# Patient Record
Sex: Female | Born: 1990 | Race: White | Hispanic: No | Marital: Married | Smoking: Current every day smoker
Health system: Southern US, Community
[De-identification: ages and names within clinical notes are randomized; demographics above are authoritative.]

---

## 2017-11-01 ENCOUNTER — Encounter (HOSPITAL_COMMUNITY): Payer: Self-pay

## 2017-11-01 ENCOUNTER — Emergency Department (HOSPITAL_COMMUNITY)
Admission: EM | Admit: 2017-11-01 | Discharge: 2017-11-01 | Disposition: A | Discharge status: Home / Self Care | Payer: Managed Care, Other (non HMO) | Attending: Emergency Medicine | Admitting: Emergency Medicine

## 2017-11-01 ENCOUNTER — Other Ambulatory Visit: Payer: Self-pay

## 2017-11-01 ENCOUNTER — Emergency Department (HOSPITAL_COMMUNITY): Payer: Managed Care, Other (non HMO)

## 2017-11-01 DIAGNOSIS — F17200 Nicotine dependence, unspecified, uncomplicated: Secondary | ICD-10-CM | POA: Diagnosis not present

## 2017-11-01 DIAGNOSIS — N10 Acute tubulo-interstitial nephritis: Secondary | ICD-10-CM | POA: Diagnosis not present

## 2017-11-01 DIAGNOSIS — R1084 Generalized abdominal pain: Secondary | ICD-10-CM | POA: Diagnosis present

## 2017-11-01 DIAGNOSIS — N12 Tubulo-interstitial nephritis, not specified as acute or chronic: Secondary | ICD-10-CM

## 2017-11-01 LAB — COMPREHENSIVE METABOLIC PANEL
ALBUMIN: 4.3 g/dL (ref 3.5–5.0)
ALT: 13 U/L (ref 0–44)
ANION GAP: 8 (ref 5–15)
AST: 16 U/L (ref 15–41)
Alkaline Phosphatase: 63 U/L (ref 38–126)
BUN: 14 mg/dL (ref 6–20)
CALCIUM: 9.1 mg/dL (ref 8.9–10.3)
CHLORIDE: 108 mmol/L (ref 98–111)
CO2: 21 mmol/L — AB (ref 22–32)
Creatinine, Ser: 0.83 mg/dL (ref 0.44–1.00)
GFR calc Af Amer: >60 mL/min (ref >60)
GFR calc non Af Amer: >60 mL/min (ref >60)
GLUCOSE: 95 mg/dL (ref 70–99)
POTASSIUM: 3.9 mmol/L (ref 3.5–5.1)
SODIUM: 137 mmol/L (ref 135–145)
Total Bilirubin: 1.9 mg/dL — ABNORMAL HIGH (ref 0.3–1.2)
Total Protein: 6.9 g/dL (ref 6.5–8.1)

## 2017-11-01 LAB — URINALYSIS, ROUTINE W REFLEX MICROSCOPIC
BILIRUBIN URINE: NEGATIVE
Glucose, UA: NEGATIVE mg/dL
Ketones, ur: NEGATIVE mg/dL
NITRITE: POSITIVE — AB
PH: 7 (ref 5.0–8.0)
Protein, ur: 100 mg/dL — AB
SPECIFIC GRAVITY, URINE: 1.013 (ref 1.005–1.030)

## 2017-11-01 LAB — LIPASE, BLOOD: LIPASE: 34 U/L (ref 11–51)

## 2017-11-01 LAB — CBC
HEMATOCRIT: 39 % (ref 36.0–46.0)
HEMOGLOBIN: 13.8 g/dL (ref 12.0–15.0)
MCH: 31.5 pg (ref 26.0–34.0)
MCHC: 35.4 g/dL (ref 30.0–36.0)
MCV: 89 fL (ref 78.0–100.0)
Platelets: 254 10*3/uL (ref 150–400)
RBC: 4.38 MIL/uL (ref 3.87–5.11)
RDW: 12.3 % (ref 11.5–15.5)
WBC: 14.4 10*3/uL — ABNORMAL HIGH (ref 4.0–10.5)

## 2017-11-01 LAB — I-STAT BETA HCG BLOOD, ED (MC, WL, AP ONLY): I-stat hCG, quantitative: <5 m[IU]/mL (ref <5)

## 2017-11-01 LAB — POC URINE PREG, ED: Preg Test, Ur: NEGATIVE

## 2017-11-01 MED ORDER — CEPHALEXIN 500 MG PO CAPS: 500.0000 mg | ORAL_CAPSULE | Freq: Three times a day (TID) | ORAL | 0 refills | Status: AC

## 2017-11-01 MED ORDER — MORPHINE SULFATE (PF) 4 MG/ML IV SOLN
4.0000 mg | Freq: Once | INTRAVENOUS | Status: AC
  Administered 2017-11-01: 4 mg via INTRAVENOUS
  Filled 2017-11-01: qty 1

## 2017-11-01 MED ORDER — TRAMADOL HCL 50 MG PO TABS: 50.0000 mg | ORAL_TABLET | Freq: Four times a day (QID) | ORAL | 0 refills | Status: AC | PRN

## 2017-11-01 MED ORDER — PROMETHAZINE HCL 25 MG/ML IJ SOLN
12.5000 mg | Freq: Once | INTRAMUSCULAR | Status: AC
  Administered 2017-11-01: 12.5 mg via INTRAVENOUS
  Filled 2017-11-01: qty 1

## 2017-11-01 MED ORDER — ONDANSETRON 4 MG PO TBDP
4.0000 mg | ORAL_TABLET | Freq: Once | ORAL | Status: AC | PRN
  Administered 2017-11-01: 4 mg via ORAL
  Filled 2017-11-01: qty 1

## 2017-11-01 MED ORDER — SODIUM CHLORIDE 0.9 % IV SOLN
1.0000 g | Freq: Once | INTRAVENOUS | Status: AC
  Administered 2017-11-01: 1 g via INTRAVENOUS
  Filled 2017-11-01: qty 10

## 2017-11-01 NOTE — Discharge Instructions (Addendum)
Please read attached information. If you experience any new or worsening signs or symptoms please return to the emergency room for evaluation. Please follow-up with your primary care provider or specialist as discussed. Please use medication prescribed only as directed and discontinue taking if you have any concerning signs or symptoms.   °

## 2017-11-01 NOTE — ED Triage Notes (Signed)
Pt. Developed lt. Flank pain last night that radiates into her lt. Pelvis area.  Pt. Also reports having pressure when she voids and having difficulty voiding.  Pt. Having n/v. She denies any vaginal bleeding or discharge.,  Pt. Is alert and oriented she denies any hx of kidney stone,.

## 2017-11-01 NOTE — ED Provider Notes (Signed)
[facility] EMERGENCY DEPARTMENT Provider Note   CSN: [id number] Arrival date & time: 11/01/17  1157    History   Chief Complaint Chief Complaint  Patient presents with  . Flank Pain  . Abdominal Pain  . Nausea    HPI Dorothy Gonzales is a 27 y.o. female.  HPI    27 year old female presents today with complaints of left-sided flank pain.  Patient notes yesterday she was in her usual state of health, but developed sharp flank pain on the left side.  She notes radiation down into the left pelvis, pressure with urination.  She notes she is having nausea no vomiting, she denies any vaginal bleeding or discharge.  She denies any fever at home but has not been checking her temperature.  She denies any history of kidney stones.  No recent urinary tract infections, no recent antibiotics.  No chronic health conditions or daily medications.   History reviewed. No pertinent past medical history.  There are no active problems to display for this patient.   History reviewed. No pertinent surgical history.   OB History   None      Home Medications    Prior to Admission medications   Medication Sig Start Date End Date Taking? Authorizing Provider  cephALEXin (KEFLEX) 500 MG capsule Take 1 capsule (500 mg total) by mouth 3 (three) times daily. 11/01/17   Joshlyn Beadle, Tinnie Gens, PA-C  traMADol (ULTRAM) 50 MG tablet Take 1 tablet (50 mg total) by mouth every 6 (six) hours as needed. 11/01/17   Eyvonne Mechanic, PA-C    Family History No family history on file.  Social History Social History   Tobacco Use  . Smoking status: Current Every Day Smoker  . Smokeless tobacco: Never Used  Substance Use Topics  . Alcohol use: Not Currently  . Drug use: Not Currently     Allergies   Patient has no allergy information on record.   Review of Systems Review of Systems  All other systems reviewed and are negative.    Physical Exam Updated Vital Signs BP (!) 155/96  (BP Location: Right Arm)   Pulse 69   Temp 98.8 F (37.1 C) (Oral)   Resp 18   Ht 5\' 4"  (1.626 m)   Wt 95.3 kg   SpO2 97%   BMI 36.05 kg/m   Physical Exam  Constitutional: She is oriented to person, place, and time. She appears well-developed and well-nourished.  HENT:  Head: Normocephalic and atraumatic.  Eyes: Pupils are equal, round, and reactive to light. Conjunctivae are normal. Right eye exhibits no discharge. Left eye exhibits no discharge. No scleral icterus.  Neck: Normal range of motion. No JVD present. No tracheal deviation present.  Pulmonary/Chest: Effort normal. No stridor.  Abdominal:  Abdomen soft- minimal tenderness to palpation of the suprapubic region, remainder of abdomen soft nontender  Musculoskeletal:  Minor left-sided CVA tenderness  Neurological: She is alert and oriented to person, place, and time. Coordination normal.  Psychiatric: She has a normal mood and affect. Her behavior is normal. Judgment and thought content normal.  Nursing note and vitals reviewed.    ED Treatments / Results  Labs (all labs ordered are listed, but only abnormal results are displayed) Labs Reviewed  URINALYSIS, ROUTINE W REFLEX MICROSCOPIC - Abnormal; Notable for the following components:      Result Value   APPearance CLOUDY (*)    Hgb urine dipstick MODERATE (*)    Protein, ur 100 (*)    Nitrite  POSITIVE (*)    Leukocytes, UA LARGE (*)    WBC, UA >50 (*)    Bacteria, UA FEW (*)    Non Squamous Epithelial 0-5 (*)    All other components within normal limits  COMPREHENSIVE METABOLIC PANEL - Abnormal; Notable for the following components:   CO2 21 (*)    Total Bilirubin 1.9 (*)    All other components within normal limits  CBC - Abnormal; Notable for the following components:   WBC 14.4 (*)    All other components within normal limits  URINE CULTURE  LIPASE, BLOOD  I-STAT BETA HCG BLOOD, ED ([facility], [facility], [facility] ONLY)  POC URINE PREG, ED    EKG None  Radiology Ct  Renal Stone Study  Result Date: 11/01/2017 CLINICAL DATA:  Left flank pain with nausea and vomiting EXAM: CT ABDOMEN AND PELVIS WITHOUT CONTRAST TECHNIQUE: Multidetector CT imaging of the abdomen and pelvis was performed following the standard protocol without IV contrast. COMPARISON:  None. FINDINGS: Lower chest: No acute abnormality. Hepatobiliary: No focal liver abnormality is seen. No gallstones, gallbladder wall thickening, or biliary dilatation. Pancreas: Unremarkable. No pancreatic ductal dilatation or surrounding inflammatory changes. Spleen: Normal in size without focal abnormality. Adrenals/Urinary Tract: Adrenal glands are normal. No hydronephrosis. Mild soft tissue stranding around the left renal pelvis. Mildly prominent left ureter with periureteral soft tissue thickening and stranding. No definite calcified stone. Bladder is normal Stomach/Bowel: Stomach is within normal limits. Appendix appears normal. No evidence of bowel wall thickening, distention, or inflammatory changes. Vascular/Lymphatic: No significant vascular findings are present. No enlarged abdominal or pelvic lymph nodes. Reproductive: No adnexal mass.  Intrauterine device in place Other: No abdominal wall hernia or abnormality. No abdominopelvic ascites. Musculoskeletal: No acute or significant osseous findings. IMPRESSION: 1. Soft tissue stranding and edema around the left renal pelvis and left ureter. No significant hydronephrosis or evidence for ureteral stone. Findings could be secondary to recently passed stone versus ascending urinary tract infection. 2. There are no other acute intra-abdominal or pelvic abnormalities visualized. Electronically Signed   By: Jasmine Pang M.D.   On: 11/01/2017 17:42    Procedures Procedures (including critical care time)  Medications Ordered in ED Medications  ondansetron (ZOFRAN-ODT) disintegrating tablet 4 mg (4 mg Oral Given 11/01/17 1223)  morphine 4 MG/ML injection 4 mg (4 mg  Intravenous Given 11/01/17 1531)  promethazine (PHENERGAN) injection 12.5 mg (12.5 mg Intravenous Given 11/01/17 1531)  cefTRIAXone (ROCEPHIN) 1 g in sodium chloride 0.9 % 100 mL IVPB (0 g Intravenous Stopped 11/01/17 1614)     Initial Impression / Assessment and Plan / ED Course  I have reviewed the triage vital signs and the nursing notes.  Pertinent labs & imaging results that were available during my care of the patient were reviewed by me and considered in my medical decision making (see chart for details).     Labs: Point-of-care urine prior, i-STAT beta-hCG, lipase, CMP, CBC, urinalysis, urine culture  Imaging: Due to renal  Consults:  Therapeutics: Morphine, promethazine, ceftriaxone  Discharge Meds: Keflex, Ultram  Assessment/Plan: 27 year old female presents today with urinary tract infection.  CT shows inflammation of the renal pelvis, no signs of kidney stones or obstruction.  Patient's urine consistent with urinary tract infection.  Urine culture obtained, patient given a dose of ceftriaxone, she will be discharged home on Keflex given local sensitivity patterns.  Patient is given strict return precautions in the event symptoms do not improve in 48 hours, or if they worsen she  will return immediately to the emergency room.  She will follow-up as an outpatient with primary care for any concerning signs or symptoms.  Patient verbalized understanding and agreement to today's plan had no further questions or concerns at the time discharge.      Final Clinical Impressions(s) / ED Diagnoses   Final diagnoses:  Pyelonephritis    ED Discharge Orders         Ordered    cephALEXin (KEFLEX) 500 MG capsule  3 times daily     11/01/17 1902    traMADol (ULTRAM) 50 MG tablet  Every 6 hours PRN     11/01/17 1902           Eyvonne Mechanic, PA-C 11/01/17 2000    Long, Arlyss Repress, MD 11/02/17 1323

## 2017-11-01 NOTE — ED Notes (Signed)
Patient verbalizes understanding of discharge instructions. Opportunity for questioning and answers were provided. Armband removed by staff, pt discharged from ED ambulatory with family.  

## 2017-11-03 LAB — URINE CULTURE: Culture: NO GROWTH

## 2019-05-11 IMAGING — CT CT RENAL STONE PROTOCOL
2 of 4 series · 16 of 46 positions shown, 18 images · non-contrast
Comparison: None.

CLINICAL DATA: Left flank pain with nausea and vomiting

EXAM:
CT ABDOMEN AND PELVIS WITHOUT CONTRAST
TECHNIQUE: Multidetector CT imaging of the abdomen and pelvis was performed
following the standard protocol without IV contrast.

[Series 3: renal stone 5.0 · axial · 0.91mm/px · z∈[+714,+1144]mm · 13 of 96 slices shown, 15 images]
[im 5/96  soft-tissue]
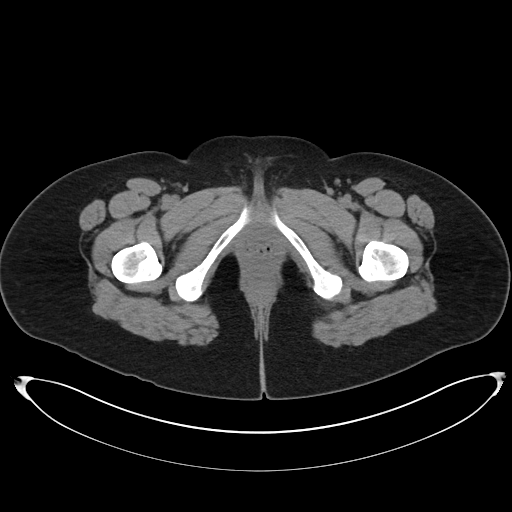
[im 5/96  bone]
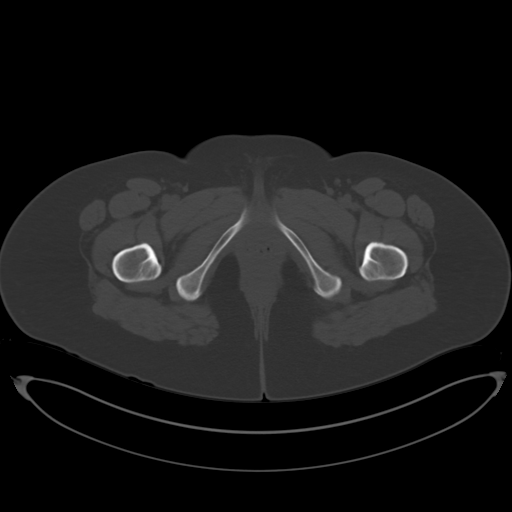
[im 13/96  soft-tissue]
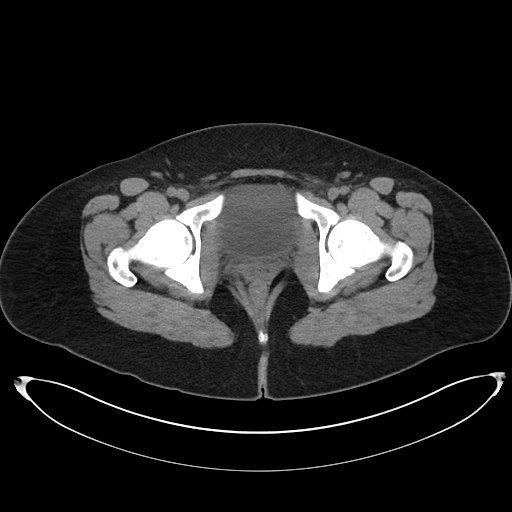
[im 22/96  soft-tissue]
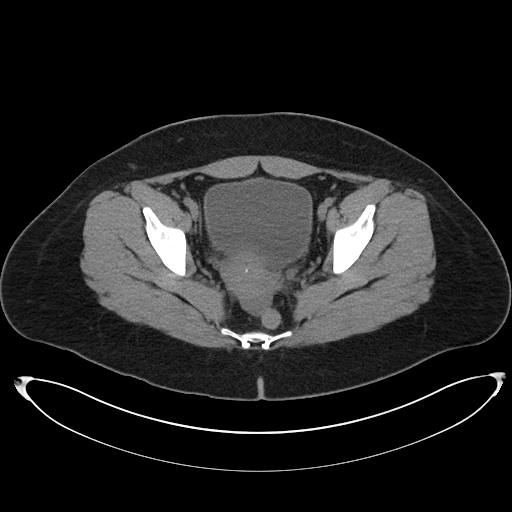
[im 26/96  soft-tissue]
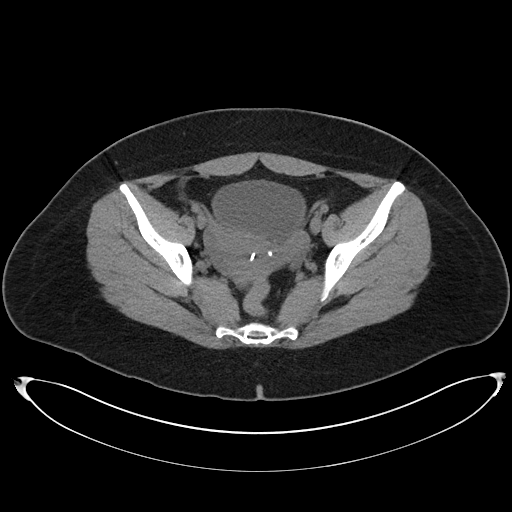
[im 35/96  soft-tissue]
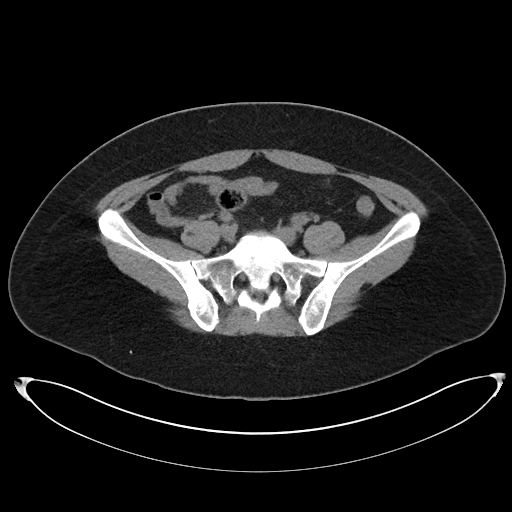
[im 39/96  soft-tissue]
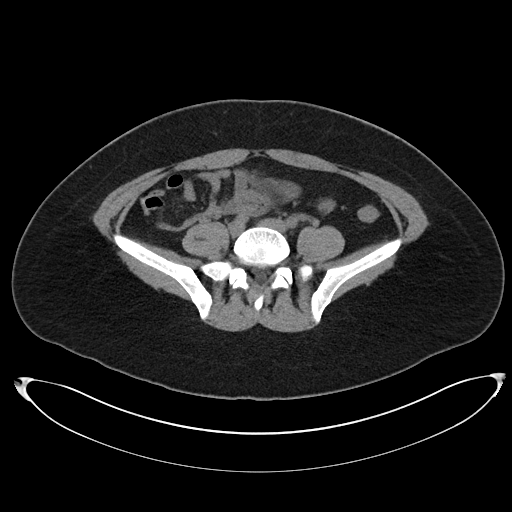
[im 48/96  soft-tissue]
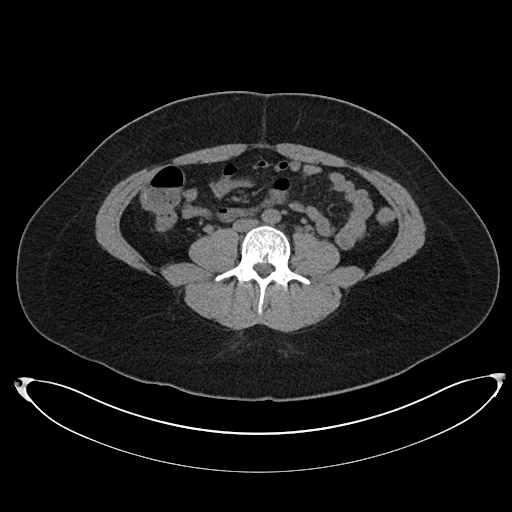
[im 57/96  soft-tissue]
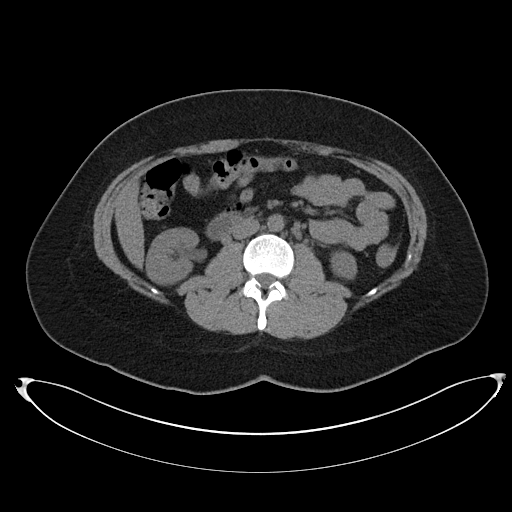
[im 61/96  soft-tissue]
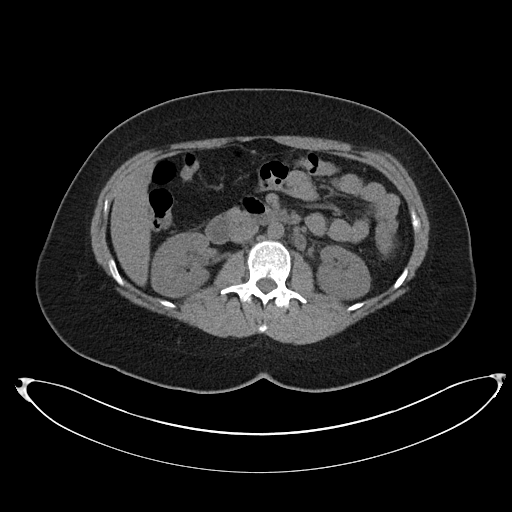
[im 61/96  bone]
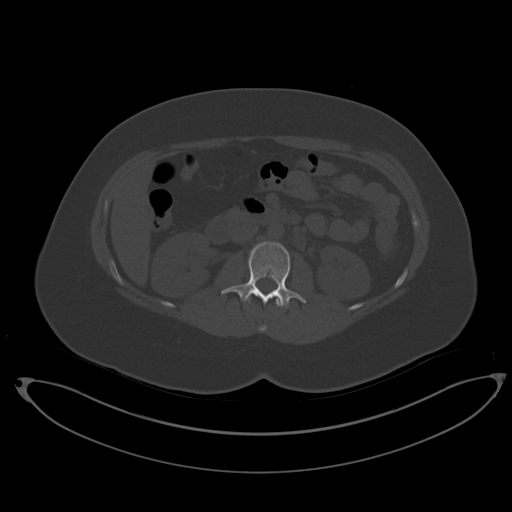
[im 70/96  soft-tissue]
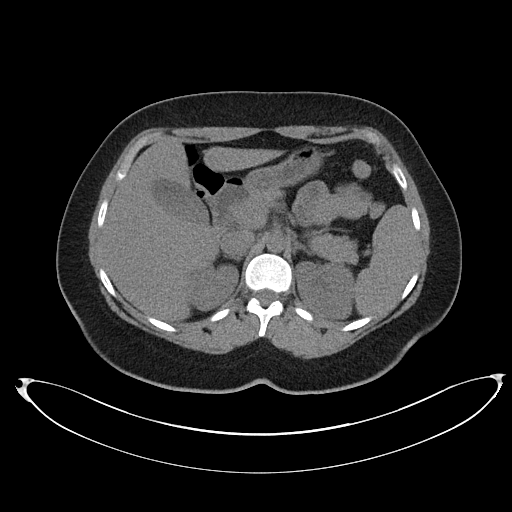
[im 74/96  soft-tissue]
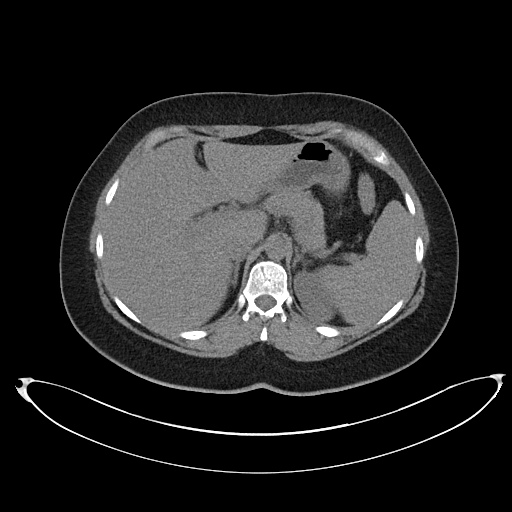
[im 83/96  soft-tissue]
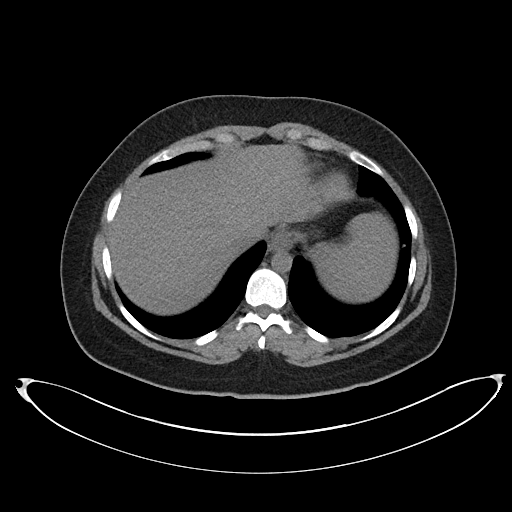
[im 91/96  soft-tissue]
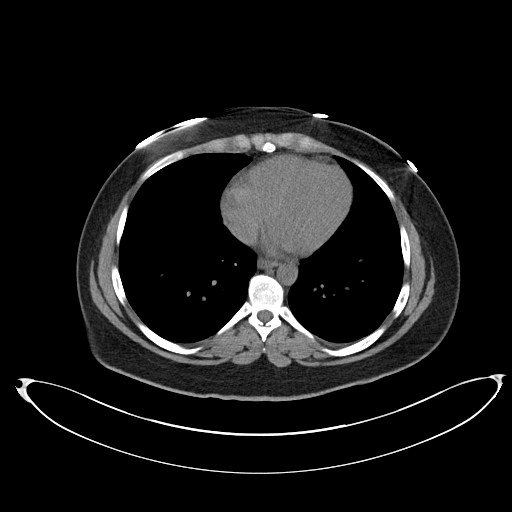

[Series 5: renal stone 3.0 cor · coronal · 0.72mm/px · 3 of 93 slices shown]
[im 31/93  soft-tissue]
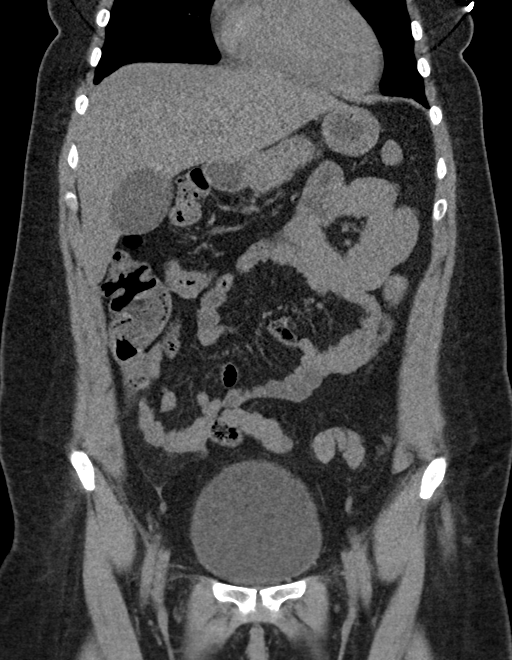
[im 41/93  soft-tissue]
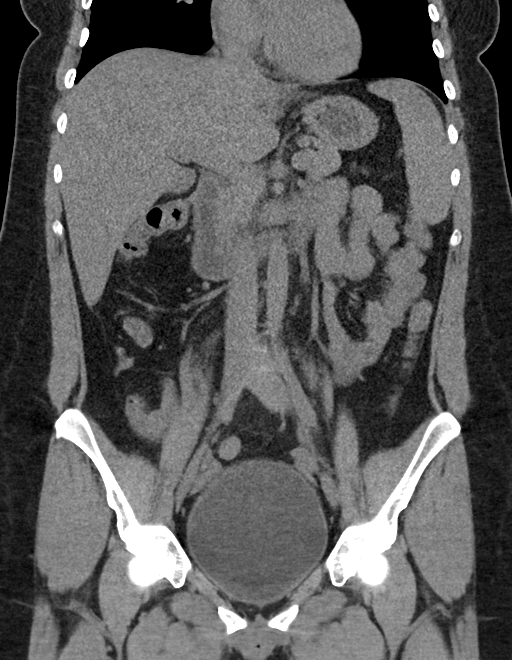
[im 52/93  soft-tissue]
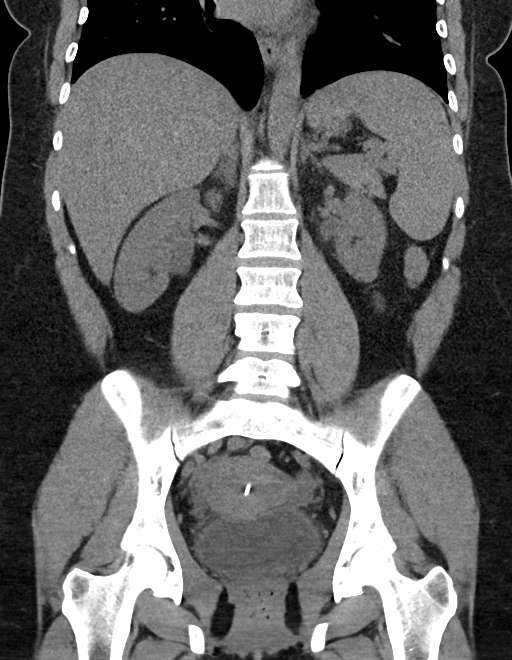

[16 of 46 positions shown; findings below may reference images not displayed]

FINDINGS: Lower chest: No acute abnormality.

Hepatobiliary: No focal liver abnormality is seen. No gallstones,
gallbladder wall thickening, or biliary dilatation.

Pancreas: Unremarkable. No pancreatic ductal dilatation or
surrounding inflammatory changes.

Spleen: Normal in size without focal abnormality.

Adrenals/Urinary Tract: Adrenal glands are normal. No
hydronephrosis. Mild soft tissue stranding around the left renal
pelvis. Mildly prominent left ureter with periureteral soft tissue
thickening and stranding. No definite calcified stone. Bladder is
normal

Stomach/Bowel: Stomach is within normal limits. Appendix appears
normal. No evidence of bowel wall thickening, distention, or
inflammatory changes.

Vascular/Lymphatic: No significant vascular findings are present. No
enlarged abdominal or pelvic lymph nodes.

Reproductive: No adnexal mass.  Intrauterine device in place

Other: No abdominal wall hernia or abnormality. No abdominopelvic
ascites.

Musculoskeletal: No acute or significant osseous findings.
IMPRESSION: 1. Soft tissue stranding and edema around the left renal pelvis and
left ureter. No significant hydronephrosis or evidence for ureteral
stone. Findings could be secondary to recently passed stone versus
ascending urinary tract infection.
2. There are no other acute intra-abdominal or pelvic abnormalities
visualized.

## 2022-04-05 ENCOUNTER — Ambulatory Visit (HOSPITAL_COMMUNITY): Payer: Self-pay

## 2023-01-28 ENCOUNTER — Other Ambulatory Visit: Payer: Self-pay

## 2023-01-28 ENCOUNTER — Emergency Department (HOSPITAL_COMMUNITY): Payer: Self-pay

## 2023-01-28 ENCOUNTER — Emergency Department (HOSPITAL_COMMUNITY)
Admission: EM | Admit: 2023-01-28 | Discharge: 2023-01-28 | Disposition: A | Discharge status: Home / Self Care | Payer: Self-pay | Attending: Emergency Medicine | Admitting: Emergency Medicine

## 2023-01-28 ENCOUNTER — Encounter (HOSPITAL_COMMUNITY): Payer: Self-pay | Admitting: *Deleted

## 2023-01-28 DIAGNOSIS — S99922A Unspecified injury of left foot, initial encounter: Secondary | ICD-10-CM | POA: Insufficient documentation

## 2023-01-28 DIAGNOSIS — Y92002 Bathroom of unspecified non-institutional (private) residence single-family (private) house as the place of occurrence of the external cause: Secondary | ICD-10-CM | POA: Insufficient documentation

## 2023-01-28 DIAGNOSIS — I739 Peripheral vascular disease, unspecified: Secondary | ICD-10-CM | POA: Insufficient documentation

## 2023-01-28 DIAGNOSIS — W19XXXA Unspecified fall, initial encounter: Secondary | ICD-10-CM | POA: Insufficient documentation

## 2023-01-28 LAB — CBC WITH DIFFERENTIAL/PLATELET
Abs Immature Granulocytes: 0.06 10*3/uL (ref 0.00–0.07)
Basophils Absolute: 0.1 10*3/uL (ref 0.0–0.1)
Basophils Relative: 1 %
Eosinophils Absolute: 0.1 10*3/uL (ref 0.0–0.5)
Eosinophils Relative: 1 %
HCT: 45.2 % (ref 36.0–46.0)
Hemoglobin: 14.7 g/dL (ref 12.0–15.0)
Immature Granulocytes: 0 %
Lymphocytes Relative: 22 %
Lymphs Abs: 3.1 10*3/uL (ref 0.7–4.0)
MCH: 30.4 pg (ref 26.0–34.0)
MCHC: 32.5 g/dL (ref 30.0–36.0)
MCV: 93.4 fL (ref 80.0–100.0)
Monocytes Absolute: 0.6 10*3/uL (ref 0.1–1.0)
Monocytes Relative: 4 %
Neutro Abs: 10.3 10*3/uL — ABNORMAL HIGH (ref 1.7–7.7)
Neutrophils Relative %: 72 %
Platelets: 268 10*3/uL (ref 150–400)
RBC: 4.84 MIL/uL (ref 3.87–5.11)
RDW: 12.9 % (ref 11.5–15.5)
WBC: 14.2 10*3/uL — ABNORMAL HIGH (ref 4.0–10.5)
nRBC: 0 % (ref 0.0–0.2)

## 2023-01-28 LAB — COMPREHENSIVE METABOLIC PANEL
ALT: 18 U/L (ref 0–44)
AST: 23 U/L (ref 15–41)
Albumin: 4.5 g/dL (ref 3.5–5.0)
Alkaline Phosphatase: 79 U/L (ref 38–126)
Anion gap: 12 (ref 5–15)
BUN: 14 mg/dL (ref 6–20)
CO2: 21 mmol/L — ABNORMAL LOW (ref 22–32)
Calcium: 9.5 mg/dL (ref 8.9–10.3)
Chloride: 104 mmol/L (ref 98–111)
Creatinine, Ser: 0.88 mg/dL (ref 0.44–1.00)
GFR, Estimated: >60 mL/min (ref >60)
Glucose, Bld: 82 mg/dL (ref 70–99)
Potassium: 3.7 mmol/L (ref 3.5–5.1)
Sodium: 137 mmol/L (ref 135–145)
Total Bilirubin: 1.1 mg/dL (ref <1.2)
Total Protein: 7.4 g/dL (ref 6.5–8.1)

## 2023-01-28 LAB — CK: Total CK: 71 U/L (ref 38–234)

## 2023-01-28 LAB — HCG, SERUM, QUALITATIVE: Preg, Serum: NEGATIVE

## 2023-01-28 MED ORDER — IOHEXOL 350 MG/ML SOLN
100.0000 mL | Freq: Once | INTRAVENOUS | Status: AC | PRN
  Administered 2023-01-28: 100 mL via INTRAVENOUS

## 2023-01-28 NOTE — ED Provider Notes (Signed)
[facility] EMERGENCY DEPARTMENT AT [facility] Provider Note   CSN: [id number] Arrival date & time: 01/28/23  1531     History  Chief Complaint  Patient presents with   Foot Pain    Dorothy Gonzales is a 32 y.o. female who presents emergency department chief complaint of left foot pain.  Patient reports that she got up about 530 this morning and went to the bathroom.  She was sitting on the commode for about 20 minutes and her leg went to sleep.  She reports that when she stood up her leg gave way and she fell.  She felt a crack in her left foot and had searing pain in the ball of her foot and toes.  She states that she got nauseous and had ringing in her ears and felt like she was going to pass out.  Her husband helped her get back to bed she was unable to bear weight on the foot and she fell back to sleep.  She woke back up around 9 AM with continued severe pain numbness and tingling in the foot.  She denies any pain with movement of the ankle.  She is able to bear weight on the foot when she heel walks but cannot apply pressure to the ball of her foot and continues to feel like her foot is cold, numb and tingly.  She is never had anything like this before.   Foot Pain       Home Medications Prior to Admission medications   Medication Sig Start Date End Date Taking? Authorizing Provider  cephALEXin (KEFLEX) 500 MG capsule Take 1 capsule (500 mg total) by mouth 3 (three) times daily. 11/01/17   Hedges, Tinnie Gens, PA-C  traMADol (ULTRAM) 50 MG tablet Take 1 tablet (50 mg total) by mouth every 6 (six) hours as needed. 11/01/17   Eyvonne Mechanic, PA-C      Allergies    Patient has no known allergies.    Review of Systems   Review of Systems  Physical Exam Updated Vital Signs BP (!) 149/96 (BP Location: Right Arm)   Pulse 79   Temp 98.2 F (36.8 C)   Resp 16   Ht 5\' 4"  (1.626 m)   Wt 95.3 kg   LMP 01/28/2023   SpO2 98%   BMI 36.06 kg/m  Physical Exam Vitals and  nursing note reviewed.  Constitutional:      General: She is not in acute distress.    Appearance: She is well-developed. She is not diaphoretic.  HENT:     Head: Normocephalic and atraumatic.     Right Ear: External ear normal.     Left Ear: External ear normal.     Nose: Nose normal.     Mouth/Throat:     Mouth: Mucous membranes are moist.  Eyes:     General: No scleral icterus.    Conjunctiva/sclera: Conjunctivae normal.  Cardiovascular:     Rate and Rhythm: Normal rate and regular rhythm.     Heart sounds: Normal heart sounds. No murmur heard.    No friction rub. No gallop.  Pulmonary:     Effort: Pulmonary effort is normal. No respiratory distress.     Breath sounds: Normal breath sounds.  Abdominal:     General: Bowel sounds are normal. There is no distension.     Palpations: Abdomen is soft. There is no mass.     Tenderness: There is no abdominal tenderness. There is no guarding.  Musculoskeletal:  Cervical back: Normal range of motion.     Comments: Bilateral foot and ankle examination is performed.  The left foot is cold to the touch with diminished capillary refill lasting about 5 seconds.  This is a marked difference in temperature to her right foot.  She has full range of motion of the left ankle, no heel or ankle tenderness, no tenderness along the Achilles tendon.  There is no significant swelling in the foot.  She has no palpable DP or PT pulse on the left side however I am able to Doppler the posterior tibialis pulse on the left and she has palpable DP and PT pulses in the right foot.  Skin:    General: Skin is warm and dry.  Neurological:     Mental Status: She is alert and oriented to person, place, and time.  Psychiatric:        Behavior: Behavior normal.     ED Results / Procedures / Treatments   Labs (all labs ordered are listed, but only abnormal results are displayed) Labs Reviewed  CBC WITH DIFFERENTIAL/PLATELET  COMPREHENSIVE METABOLIC PANEL  CK   HCG, SERUM, QUALITATIVE    EKG None  Radiology DG Foot Complete Left  Result Date: 01/28/2023 CLINICAL DATA:  Felt cracking in mid to lateral left foot, now with pain and numbness in toes EXAM: LEFT FOOT - COMPLETE 3+ VIEW COMPARISON:  None Available. FINDINGS: There is no evidence of fracture or dislocation. There is no evidence of arthropathy or other focal bone abnormality. Soft tissues are unremarkable. IMPRESSION: No fracture or dislocation of the left foot. No radiographic findings to explain pain. Electronically Signed   By: Jearld Lesch M.D.   On: 01/28/2023 16:58    Procedures Procedures    Medications Ordered in ED Medications - No data to display  ED Course/ Medical Decision Making/ A&P Clinical Course as of 01/28/23 2330  Sat Jan 28, 2023  1725 Case discussed with Dr. Hetty Blend of vein and vascular service.  He recommends CT angiogram of the left leg.  He will consult on the patient. [AH]  1800 WBC(!): 14.2 [AH]  1800 DG Foot Complete Left Visualized and interpreted a left foot x-ray which shows no acute findings [AH]  1922 CT Angio Aortobifemoral W and/or Wo Contrast [AH]  2016 Case discussed with Dr. Hetty Blend. Images reviewed. No signs of vascular occlusion [AH]    Clinical Course User Index [AH] Arthor Captain, PA-C                                 Medical Decision Making Patient here with foot injury.  She had cold left foot with decreased cap refill .  I consulted with vascular surgery.  Plain films and CT scan negative as per ED course.  Suspect vasospastic event in the setting of trauma to the foot.  Secondary differential includes complex regional pain syndrome.. Patient given walking boot and crutches, home care instructions and return precautions. Appropriate for discharge  Amount and/or Complexity of Data Reviewed External Data Reviewed: labs. Labs: ordered. Decision-making details documented in ED Course.    Details: As per ED course, no acute  findings Radiology: ordered and independent interpretation performed. Decision-making details documented in ED Course. ECG/medicine tests: ordered.  Risk Prescription drug management.          Final Clinical Impression(s) / ED Diagnoses Final diagnoses:  Foot injury, left, initial encounter  Peripheral artery  vasospasm [facility])    Rx / DC Orders ED Discharge Orders     None         Arthor Captain, PA-C 01/28/23 2338    Linwood Dibbles, MD 01/29/23 [phone]

## 2023-01-28 NOTE — Consult Note (Incomplete)
Hospital Consult    Reason for Consult: Left foot pain and numbness Requesting service: Emergency department  MRN #:  [id number]  History of Present Illness: This is a 32 y.o. female who presented to the emergency department with left foot pain and numbness after sustaining a ground-level fall this morning.  She was unable to bear weight on the foot immediately after the injury but is now able to walk with some pain.  She feels that her foot is cool still numb.  She denies any motor deficit.  She denies any pain with movement of the ankle.  History reviewed. No pertinent past medical history.  History reviewed. No pertinent surgical history.  No Known Allergies  Prior to Admission medications   Medication Sig Start Date End Date Taking? Authorizing Provider  cephALEXin (KEFLEX) 500 MG capsule Take 1 capsule (500 mg total) by mouth 3 (three) times daily. 11/01/17   Hedges, Tinnie Gens, PA-C  traMADol (ULTRAM) 50 MG tablet Take 1 tablet (50 mg total) by mouth every 6 (six) hours as needed. 11/01/17   Eyvonne Mechanic, PA-C    Social History   Socioeconomic History   Marital status: Married    Spouse name: Not on file   Number of children: Not on file   Years of education: Not on file   Highest education level: Not on file  Occupational History   Not on file  Tobacco Use   Smoking status: Every Day   Smokeless tobacco: Never  Vaping Use   Vaping status: Every Day  Substance and Sexual Activity   Alcohol use: Not Currently   Drug use: Not Currently   Sexual activity: Yes    Birth control/protection: Surgical  Other Topics Concern   Not on file  Social History Narrative   Not on file   Social Determinants of Health   Financial Resource Strain: Low Risk  (11/20/2022)   Received from [facility]   Overall Financial Resource Strain (CARDIA)    Difficulty of Paying Living Expenses: Not very hard  Food Insecurity: No Food Insecurity (11/20/2022)   Received from [facility]    Hunger Vital Sign    Worried About Running Out of Food in the Last Year: Never true    Ran Out of Food in the Last Year: Never true  Transportation Needs: No Transportation Needs (11/20/2022)   Received from [facility] - Transportation    Lack of Transportation (Medical): No    Lack of Transportation (Non-Medical): No  Physical Activity: Insufficiently Active (11/20/2022)   Received from [facility]   Exercise Vital Sign    Days of Exercise per Week: 4 days    Minutes of Exercise per Session: 20 min  Stress: Stress Concern Present (11/20/2022)   Received from [facility] - Occupational Stress Questionnaire    Feeling of Stress : To some extent  Social Connections: Moderately Integrated (11/20/2022)   Received from [facility]   Social Network    How would you rate your social network (family, work, friends)?: Adequate participation with social networks  Intimate Partner Violence: Not At Risk (11/20/2022)   Received from [facility]   HITS    Over the last 12 months how often did your partner physically hurt you?: Never    Over the last 12 months how often did your partner insult you or talk down to you?: Never    Over the last 12 months how often did your  partner threaten you with physical harm?: Never    Over the last 12 months how often did your partner scream or curse at you?: Never    History reviewed. No pertinent family history.  ROS: Otherwise negative unless mentioned in HPI  Physical Examination  Vitals:   01/28/23 1603  BP: (!) 149/96  Pulse: 79  Resp: 16  Temp: 98.2 F (36.8 C)  SpO2: 98%   Body mass index is 36.06 kg/m.  General: no acute distress Cardiac: hemodynamically stable, nontachycardic Pulm: normal work of breathing GI: non-tender, no pulsatile mass  Neuro: alert, no focal deficit Extremities: No edema, cyanosis or wounds Vascular:   Right: Palpable femoral, DP, PT  Left: Palpable  femoral, AT, PT   Data:   CTA demonstrates widely patent arterial vasculature of bilateral lower extremities with three-vessel runoff bilaterally    ASSESSMENT/PLAN: This is a 32 y.o. female with left foot pain after abdominal fall.  There is not appear to be any arterial injury and the decreased pulse in the left DP is likely due to vasospasm and inflammation from her soft tissue injury.  -No indication for vascular invention, no follow-up needed   Daria Pastures MD MS [facility] [phone] 01/28/2023  7:15 PM

## 2023-01-28 NOTE — ED Triage Notes (Signed)
The pt had a nausea vomiting episode this am  coming from the br she fell striking her lt foot  she has numbness in her toes since then

## 2023-01-28 NOTE — Discharge Instructions (Signed)
Your imaging and vascular studies are negative for any emergent findings today. Follow directions discussed at bedside. Follow up with orthopedics or podiatry if symptoms are not improving. Get help right away if you develop your uncontrolled pain, discoloration blue or purple digits.  Digits become pale and white and numb.  You develop chest pain or shortness of breath.
